# Patient Record
Sex: Female | Born: 2005 | Race: White | Hispanic: No | Marital: Single | Smoking: Never smoker
Health system: Southern US, Community
[De-identification: ages and names within clinical notes are randomized; demographics above are authoritative.]

## PROBLEM LIST (undated history)

## (undated) HISTORY — PX: TONSILLECTOMY: SUR1361

---
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
## 2023-01-03 DIAGNOSIS — R102 Pelvic and perineal pain: Secondary | ICD-10-CM | POA: Diagnosis not present

## 2023-01-10 DIAGNOSIS — R102 Pelvic and perineal pain: Secondary | ICD-10-CM | POA: Diagnosis not present

## 2023-01-10 DIAGNOSIS — Z7689 Persons encountering health services in other specified circumstances: Secondary | ICD-10-CM | POA: Diagnosis not present

## 2023-01-18 ENCOUNTER — Encounter: Payer: Self-pay | Admitting: Internal Medicine

## 2023-01-18 ENCOUNTER — Ambulatory Visit: Payer: BC Managed Care – PPO | Admitting: Internal Medicine

## 2023-01-18 VITALS — BP 126/72 | HR 58 | Temp 97.3°F | Ht 67.0 in | Wt 237.0 lb

## 2023-01-18 DIAGNOSIS — E6609 Other obesity due to excess calories: Secondary | ICD-10-CM | POA: Diagnosis not present

## 2023-01-18 DIAGNOSIS — Z6837 Body mass index (BMI) 37.0-37.9, adult: Secondary | ICD-10-CM | POA: Diagnosis not present

## 2023-01-18 DIAGNOSIS — Z0001 Encounter for general adult medical examination with abnormal findings: Secondary | ICD-10-CM

## 2023-01-18 DIAGNOSIS — E66811 Obesity, class 1: Secondary | ICD-10-CM | POA: Insufficient documentation

## 2023-01-18 NOTE — Assessment & Plan Note (Signed)
Encourage diet and exercise for weight loss 

## 2023-01-18 NOTE — Progress Notes (Signed)
HPI  Patient presents to clinic today to establish care.  She would like her annual exam today.  Flu: never Tetanus: < 10 years ago Covid: never Dentist: biannually  Diet: She does eat meat. She consumes fruits and veggies. She tries to avoid fried foods. She drinks mostly water. Exercise: None  History reviewed. No pertinent past medical history.  Current Outpatient Medications  Medication Sig Dispense Refill   benzonatate (TESSALON PERLES) 100 MG capsule Take 1-2 tabs TID prn cough (Patient not taking: Reported on 01/18/2023) 30 capsule 0   fluticasone (FLONASE) 50 MCG/ACT nasal spray Place 2 sprays into both nostrils daily. (Patient not taking: Reported on 01/18/2023) 16 g 0   ondansetron (ZOFRAN ODT) 4 MG disintegrating tablet Take 1 tablet (4 mg total) by mouth every 8 (eight) hours as needed. (Patient not taking: Reported on 01/18/2023) 15 tablet 0   No current facility-administered medications for this visit.    No Known Allergies  Family History  Problem Relation Age of Onset   Hypertension Mother    Hypertension Father    Healthy Sister    Healthy Brother    Diabetes Maternal Grandmother    Hypertension Maternal Grandmother    Cancer Maternal Grandfather        ??    Social History   Socioeconomic History   Marital status: Single    Spouse name: Not on file   Number of children: Not on file   Years of education: Not on file   Highest education level: Not on file  Occupational History   Not on file  Tobacco Use   Smoking status: Never   Smokeless tobacco: Never  Vaping Use   Vaping Use: Every day  Substance and Sexual Activity   Alcohol use: Not on file   Drug use: Not on file   Sexual activity: Not on file  Other Topics Concern   Not on file  Social History Narrative   Not on file   Social Determinants of Health   Financial Resource Strain: Not on file  Food Insecurity: Not on file  Transportation Needs: Not on file  Physical Activity: Not on  file  Stress: Not on file  Social Connections: Not on file  Intimate Partner Violence: Not on file    ROS:  Constitutional: Denies fever, malaise, fatigue, headache or abrupt weight changes.  HEENT: Denies eye pain, eye redness, ear pain, ringing in the ears, wax buildup, runny nose, nasal congestion, bloody nose, or sore throat. Respiratory: Denies difficulty breathing, shortness of breath, cough or sputum production.   Cardiovascular: Denies chest pain, chest tightness, palpitations or swelling in the hands or feet.  Gastrointestinal: Denies abdominal pain, bloating, constipation, diarrhea or blood in the stool.  GU: Denies frequency, urgency, pain with urination, blood in urine, odor or discharge. Musculoskeletal: Denies decrease in range of motion, difficulty with gait, muscle pain or joint pain and swelling.  Skin: Denies redness, rashes, lesions or ulcercations.  Neurological: Denies dizziness, difficulty with memory, difficulty with speech or problems with balance and coordination.  Psych: Denies anxiety, depression, SI/HI.  No other specific complaints in a complete review of systems (except as listed in HPI above).  PE:  BP 126/72 (BP Location: Right Arm, Patient Position: Sitting, Cuff Size: Large)   Pulse (!) 58   Temp (!) 97.3 F (36.3 C) (Temporal)   Ht 5\' 7"  (1.702 m)   Wt 237 lb (107.5 kg)   SpO2 98%   BMI 37.12 kg/m  Wt  Readings from Last 3 Encounters:  01/18/23 237 lb (107.5 kg) (>99 %, Z= 2.36)*  06/16/22 210 lb (95.3 kg) (98 %, Z= 2.10)*  11/30/18 171 lb 11.8 oz (77.9 kg) (96 %, Z= 1.80)*   * Growth percentiles are based on CDC (Girls, 2-20 Years) data.    General: Appears her stated age, obese, in NAD. HEENT: Head: normal shape and size; Eyes: sclera white, no icterus, conjunctiva pink, PERRLA and EOMs intact;  Neck: Neck supple, trachea midline. No masses, lumps or thyromegaly present.  Cardiovascular: Bradycardic with normal rhythm. S1,S2 noted.  No  murmur, rubs or gallops noted. No JVD or BLE edema.  Pulmonary/Chest: Normal effort and positive vesicular breath sounds. No respiratory distress. No wheezes, rales or ronchi noted.  Abdomen: Soft and nontender. Normal bowel sounds. Musculoskeletal: Strength 5/5 BUE/BLE. No difficulty with gait.  Neurological: Alert and oriented. Cranial nerves II-XII grossly intact. Coordination normal.  Psychiatric: Mood and affect normal. Behavior is normal. Judgment and thought content normal.     BMET    Component Value Date/Time   NA 138 06/16/2022 1943   K 3.7 06/16/2022 1943   CL 106 06/16/2022 1943   CO2 26 06/16/2022 1943   GLUCOSE 94 06/16/2022 1943   BUN 10 06/16/2022 1943   CREATININE 0.74 06/16/2022 1943   CALCIUM 9.3 06/16/2022 1943   GFRNONAA >60 06/16/2022 1943   GFRAA 0 (L) 09/27/2018 2240    Lipid Panel  No results found for: "CHOL", "TRIG", "HDL", "CHOLHDL", "VLDL", "LDLCALC"  CBC    Component Value Date/Time   WBC 9.0 06/16/2022 1943   RBC 4.98 06/16/2022 1943   HGB 14.0 06/16/2022 1943   HCT 42.4 06/16/2022 1943   PLT 237 06/16/2022 1943   MCV 85.1 06/16/2022 1943   MCH 28.1 06/16/2022 1943   MCHC 33.0 06/16/2022 1943   RDW 12.7 06/16/2022 1943   LYMPHSABS 2.5 06/16/2022 1943   MONOABS 0.6 06/16/2022 1943   EOSABS 0.1 06/16/2022 1943   BASOSABS 0.0 06/16/2022 1943    Hgb A1C No results found for: "HGBA1C"   Assessment and Plan:  Preventative Health Maintenance:  Encouraged her to get a flu shot in the fall Tetanus UTD Encouraged her to get her COVID vaccine Encouraged her to consume a balanced diet and exercise regimen Advised her to see an eye doctor and dentist annually She declines lab work today  RTC in 1 year for your annual exam Webb Silversmith, NP

## 2023-01-18 NOTE — Patient Instructions (Signed)

## 2023-02-18 DIAGNOSIS — L700 Acne vulgaris: Secondary | ICD-10-CM | POA: Diagnosis not present

## 2023-03-24 ENCOUNTER — Ambulatory Visit: Payer: BC Managed Care – PPO | Admitting: Internal Medicine

## 2023-03-24 ENCOUNTER — Encounter: Payer: Self-pay | Admitting: Internal Medicine

## 2023-03-24 VITALS — BP 124/70 | HR 83 | Temp 95.7°F | Wt 227.0 lb

## 2023-03-24 DIAGNOSIS — R600 Localized edema: Secondary | ICD-10-CM | POA: Diagnosis not present

## 2023-03-24 DIAGNOSIS — G250 Essential tremor: Secondary | ICD-10-CM

## 2023-03-24 DIAGNOSIS — R61 Generalized hyperhidrosis: Secondary | ICD-10-CM

## 2023-03-24 MED ORDER — GLYCOPYRROLATE 1 MG PO TABS: 1.0000 mg | ORAL_TABLET | Freq: Every day | ORAL | 1 refills | Status: AC

## 2023-03-24 NOTE — Assessment & Plan Note (Signed)
Discussed propranolol to see if this would help control tremor however I do not think this is necessary at this point We will continue to monitor symptoms for now

## 2023-03-24 NOTE — Patient Instructions (Signed)
Hyperhidrosis Hyperhidrosis is a condition in which the body sweats a lot more than normal (excessively). Sweating is a necessary function for a human body. It is normal to sweat when you are hot, physically active, or anxious. However, hyperhidrosis is sweating to an excessive degree. Although the condition is not a serious one, it can cause embarrassment. There are two kinds of hyperhidrosis: Primary hyperhidrosis. The sweating usually localizes in one part of your body, such as your underarms, or in a few areas, such as your feet, face, underarms, and hands. This is the more common kind of hyperhidrosis. Secondary hyperhidrosis. This type usually affects your entire body. What are the causes? The cause of this condition depends on the kind of hyperhidrosis that you have. Primary hyperhidrosis may be caused by sweat glands that are more active than normal. Secondary hyperhidrosis may be caused by an underlying condition or by taking certain medicines, such as antidepressants or diabetes medicines. Possible conditions that may cause secondary hyperhidrosis include: Diabetes. Hot flashes during menopause. Certain types of cancers. Obesity. Overactive thyroid (hyperthyroidism). Nervous system injury or disorders. What increases the risk? You are more likely to develop primary hyperhidrosis if you have a family history of the condition. What are the signs or symptoms? Symptoms of this condition include: Feeling like you are sweating constantly, even while you are not being active. Having skin that peels or gets paler or softer in the areas where you sweat the most. Being able to see sweat on your skin. Other symptoms depend on the kind of hyperhidrosis that you have. Symptoms of primary hyperhidrosis may include: Sweating in the same location on both sides of your body. Sweating only during the day and not while you are sleeping. Sweating in specific areas, such as your underarms, palms,  feet, and face. Symptoms of secondary hyperhidrosis may include: Sweating all over your body. Sweating even while you sleep. How is this diagnosed? This condition may be diagnosed by: Medical history. Physical exam. You may also have other tests, including: Tests to measure the amount of sweat you produce and to show the areas where you sweat the most. These tests may involve: Using color-changing chemicals to show patterns of sweating on the skin. Weighing paper that has been applied to the skin. This will show the amount of sweat that your body produces. Measuring the amount of water that evaporates from the skin. Using infrared technology to show patterns of sweating on the skin. Tests to check for other conditions that may be causing excess sweating. These tests may include blood, urine, or imaging tests. How is this treated? Treatment for this condition depends on the kind of hyperhidrosis that you have and the areas of your body that are affected. Your health care provider will also treat any underlying conditions. Treatment may include: Medicines, such as: Antiperspirants. These are medicines that stop sweat. Injectable medicines. These may include small injections of botulinum toxin. Oral medicines. These are taken by mouth to treat underlying conditions and other symptoms. A procedure to: Temporarily turn off the sweat glands in your hands and feet (iontophoresis). Remove your sweat glands. Cut or destroy the nerves so that they do not send a signal to the sweat glands (sympathectomy). Follow these instructions at home: Lifestyle  Limit or avoid foods or beverages that may increase your risk of sweating, such as: Spicy food. Caffeine. Alcohol. Foods that contain monosodium glutamate (MSG). If your feet sweat: Wear sandals when possible. Do not wear cotton socks. Wear socks  that remove or wick moisture from your feet. Wear leather shoes. Avoid wearing the same pair of  shoes for two days in a row. Try placing sweat pads under your clothes to prevent underarm sweat from showing. Keep a journal of your sweat symptoms and when they occur. This may help you identify things that trigger your sweating. General instructions Take over-the-counter and prescription medicines only as told by your health care provider. Use antiperspirants as told by your health care provider. Consider joining a hyperhidrosis support group. Where to find more information [employer]: [employer] Contact a health care provider if: You have new symptoms. Your symptoms get worse. Summary Hyperhidrosis is a condition in which the body sweats a lot more than normal. With primary hyperhidrosis, the sweating usually localizes in one part of your body, such as your underarms, or in a few areas, such as your feet, face, underarms, and hands. It is caused by overactive sweat glands in the affected area. With secondary hyperhidrosis, the sweating affects your entire body. This is caused by an underlying condition or by taking certain medicines. Treatment for this condition depends on the kind of hyperhidrosis that you have and the parts of your body that are affected. This information is not intended to replace advice given to you by your health care provider. Make sure you discuss any questions you have with your health care provider. Document Revised: 12/15/2021 Document Reviewed: 12/15/2021 Elsevier Patient Education  2024 [employer].

## 2023-03-24 NOTE — Progress Notes (Signed)
Subjective:    Patient ID: Holly Everett, female    DOB: [birth year], 17 y.o.   MRN: [id number]  HPI  Patient presents to clinic today with complaint of excessive sweating. It seems worse on her underarms and feet. She reports she sweats even when she is not hot.  She has tried multiple deodorants OTC with minimal relief of symptoms.  She had a normal TSH 11/2022.  She also reports intermittent ankle swelling. She has noticed this over the last few weeks. She has not noticed any redness. She does not wake up with her ankles swollen. She has tried elevating her feet with some relief of symptoms.   She also reports intermittent tremors of her hands and feet.  She has noticed this when she was lying down to sleep that her right hand would shake.  She reports her mother was also trying to paint her toenails the other day and her feet were shaking.  She reports she tried to stop them but they continued to shake.  Review of Systems  No past medical history on file.  No current outpatient medications on file.   No current facility-administered medications for this visit.    No Known Allergies  Family History  Problem Relation Age of Onset   Hypertension Mother    Hypertension Father    Healthy Sister    Healthy Brother    Diabetes Maternal Grandmother    Hypertension Maternal Grandmother    Cancer Maternal Grandfather        ??    Social History   Socioeconomic History   Marital status: Single    Spouse name: Not on file   Number of children: Not on file   Years of education: Not on file   Highest education level: Not on file  Occupational History   Not on file  Tobacco Use   Smoking status: Never   Smokeless tobacco: Never  Vaping Use   Vaping Use: Every day  Substance and Sexual Activity   Alcohol use: Not on file   Drug use: Not on file   Sexual activity: Not on file  Other Topics Concern   Not on file  Social History Narrative   Not on file   Social Determinants  of Health   Financial Resource Strain: Not on file  Food Insecurity: Not on file  Transportation Needs: Not on file  Physical Activity: Not on file  Stress: Not on file  Social Connections: Not on file  Intimate Partner Violence: Not on file     Constitutional: Denies fever, malaise, fatigue, headache or abrupt weight changes.  HEENT: Denies eye pain, eye redness, ear pain, ringing in the ears, wax buildup, runny nose, nasal congestion, bloody nose, or sore throat. Respiratory: Denies difficulty breathing, shortness of breath, cough or sputum production.   Cardiovascular: Patient reports intermittent swelling of ankles.  Denies chest pain, chest tightness, palpitations or swelling in the hands.  Gastrointestinal: Denies abdominal pain, bloating, constipation, diarrhea or blood in the stool.  GU: Denies urgency, frequency, pain with urination, burning sensation, blood in urine, odor or discharge. Musculoskeletal: Denies decrease in range of motion, difficulty with gait, muscle pain or joint pain and swelling.  Skin: Patient reports excessive sweating.  Denies redness, rashes, lesions or ulcercations.  Neurological: Patient reports intermittent tremor of hands and feet.  Denies dizziness, difficulty with memory, difficulty with speech or problems with balance and coordination.  Psych: Denies anxiety, depression, SI/HI.  No other specific complaints in  a complete review of systems (except as listed in HPI above).     Objective:   Physical Exam  BP 124/70 (BP Location: Left Arm, Patient Position: Sitting, Cuff Size: Large)   Pulse 83   Temp (!) 95.7 F (35.4 C) (Temporal)   Wt 227 lb (103 kg)   SpO2 98%   BMI 35.55 kg/m   Wt Readings from Last 3 Encounters:  01/18/23 237 lb (107.5 kg) (>99 %, Z= 2.36)*  06/16/22 210 lb (95.3 kg) (98 %, Z= 2.10)*  11/30/18 171 lb 11.8 oz (77.9 kg) (96 %, Z= 1.80)*   * Growth percentiles are based on CDC (Girls, 2-20 Years) data.    General:  Appears her stated age, obese, in NAD. Skin: Warm, dry and intact.  HEENT: Head: normal shape and size; Eyes: sclera white, no icterus, conjunctiva pink, PERRLA and EOMs intact;  Cardiovascular: Normal rate and rhythm.  No JVD or BLE edema.  Pulmonary/Chest: Normal effort and positive vesicular breath sounds. No respiratory distress. No wheezes, rales or ronchi noted.  Musculoskeletal: No difficulty with gait.  Neurological: Alert and oriented.  Coordination normal.  Psychiatric: Mood and affect normal. Behavior is normal. Judgment and thought content normal.    BMET    Component Value Date/Time   NA 138 06/16/2022 1943   K 3.7 06/16/2022 1943   CL 106 06/16/2022 1943   CO2 26 06/16/2022 1943   GLUCOSE 94 06/16/2022 1943   BUN 10 06/16/2022 1943   CREATININE 0.74 06/16/2022 1943   CALCIUM 9.3 06/16/2022 1943   GFRNONAA >60 06/16/2022 1943   GFRAA 0 (L) 09/27/2018 2240    Lipid Panel  No results found for: "CHOL", "TRIG", "HDL", "CHOLHDL", "VLDL", "LDLCALC"  CBC    Component Value Date/Time   WBC 9.0 06/16/2022 1943   RBC 4.98 06/16/2022 1943   HGB 14.0 06/16/2022 1943   HCT 42.4 06/16/2022 1943   PLT 237 06/16/2022 1943   MCV 85.1 06/16/2022 1943   MCH 28.1 06/16/2022 1943   MCHC 33.0 06/16/2022 1943   RDW 12.7 06/16/2022 1943   LYMPHSABS 2.5 06/16/2022 1943   MONOABS 0.6 06/16/2022 1943   EOSABS 0.1 06/16/2022 1943   BASOSABS 0.0 06/16/2022 1943    Hgb A1C No results found for: "HGBA1C"         Assessment & Plan:   Peripheral Edema:  Likely combination of standing on her feet for long periods of time, weight and heat Recommend compression socks Encouraged elevation when she gets up and walk  RTC in 10 months for annual exam Nicki Reaper, NP

## 2023-03-24 NOTE — Assessment & Plan Note (Signed)
Will try glycopyrrolate 1 mg daily

## 2023-04-11 ENCOUNTER — Ambulatory Visit: Payer: BC Managed Care – PPO | Admitting: Internal Medicine

## 2023-04-11 ENCOUNTER — Encounter: Payer: Self-pay | Admitting: Internal Medicine

## 2023-04-11 ENCOUNTER — Other Ambulatory Visit (HOSPITAL_COMMUNITY)
Admission: RE | Admit: 2023-04-11 | Discharge: 2023-04-11 | Disposition: A | Discharge status: Home / Self Care | Payer: BC Managed Care – PPO | Source: Ambulatory Visit | Attending: Internal Medicine | Admitting: Internal Medicine

## 2023-04-11 VITALS — BP 116/68 | HR 84 | Temp 96.8°F | Wt 226.0 lb

## 2023-04-11 DIAGNOSIS — R350 Frequency of micturition: Secondary | ICD-10-CM | POA: Insufficient documentation

## 2023-04-11 DIAGNOSIS — R300 Dysuria: Secondary | ICD-10-CM | POA: Diagnosis not present

## 2023-04-11 DIAGNOSIS — R102 Pelvic and perineal pain: Secondary | ICD-10-CM | POA: Diagnosis not present

## 2023-04-11 DIAGNOSIS — N898 Other specified noninflammatory disorders of vagina: Secondary | ICD-10-CM | POA: Diagnosis not present

## 2023-04-11 LAB — POCT URINALYSIS DIPSTICK
Bilirubin, UA: NEGATIVE
Glucose, UA: NEGATIVE
Ketones, UA: NEGATIVE
Leukocytes, UA: NEGATIVE
Nitrite, UA: NEGATIVE
Protein, UA: NEGATIVE
Spec Grav, UA: 1.025 (ref 1.010–1.025)
Urobilinogen, UA: 0.2 E.U./dL
pH, UA: 5 (ref 5.0–8.0)

## 2023-04-11 NOTE — Patient Instructions (Signed)
Pelvic Pain, Female Pelvic pain is pain in your lower belly (abdomen), below your belly button and between your hips. The pain may: Start all of a sudden (be acute). Keep coming back (be recurring). Last a long time (become chronic). Pelvic pain that lasts longer than 6 months is called chronic pelvic pain. There are many causes of pelvic pain. Sometimes the cause of pelvic pain is not known. Follow these instructions at home:  Take over-the-counter and prescription medicines only as told by your doctor. Rest as told by your doctor. Do not have sex if it hurts. Keep a journal of your pelvic pain. Write down: When the pain started. Where the pain is located. What seems to make the pain better or worse, such as food or your monthly period (menstrual cycle). Any symptoms you have along with the pain. Keep all follow-up visits. Contact a doctor if: Medicine does not help your pain, or your pain comes back. You have new symptoms. You have unusual discharge or bleeding from your vagina. You have a fever or chills. You are having trouble pooping (constipation). You have blood in your pee (urine) or poop (stool). Your pee smells bad. You feel weak or light-headed. Get help right away if: You have sudden pain that is very bad. You have very bad pain and also have any of these symptoms: A fever. Feeling like you may vomit (nauseous). Vomiting. Being very sweaty. You faint. These symptoms may be an emergency. Get help right away. Call your local emergency services (911 in the [place] Do not wait to see if the symptoms will go away. Do not drive yourself to the hospital. Summary Pelvic pain is pain in your lower belly (abdomen), below your belly button and between your hips. There are many causes of pelvic pain. Keep a journal of your pelvic pain. This information is not intended to replace advice given to you by your health care provider. Make sure you discuss any questions you have with  your health care provider. Document Revised: 02/24/2021 Document Reviewed: 02/24/2021 Elsevier Patient Education  2024 [employer].

## 2023-04-11 NOTE — Progress Notes (Signed)
HPI  Pt presents to the clinic today with c/o pelvic pain, urinary frequency, burning with urination, vaginal odor.  She denies dysuria, blood in her urine or vaginal discharge,  She noticed this 10 days ago. Her LMP was 04/02/23.  She is sexually active.  She has not tried anything OTC for this.   Review of Systems  No past medical history on file.  Family History  Problem Relation Age of Onset   Hypertension Mother    Hypertension Father    Healthy Sister    Healthy Brother    Diabetes Maternal Grandmother    Hypertension Maternal Grandmother    Cancer Maternal Grandfather        ??    Social History   Socioeconomic History   Marital status: Single    Spouse name: Not on file   Number of children: Not on file   Years of education: Not on file   Highest education level: Not on file  Occupational History   Not on file  Tobacco Use   Smoking status: Never   Smokeless tobacco: Never  Vaping Use   Vaping Use: Every day  Substance and Sexual Activity   Alcohol use: Not on file   Drug use: Not on file   Sexual activity: Not on file  Other Topics Concern   Not on file  Social History Narrative   Not on file   Social Determinants of Health   Financial Resource Strain: Not on file  Food Insecurity: Not on file  Transportation Needs: Not on file  Physical Activity: Not on file  Stress: Not on file  Social Connections: Not on file  Intimate Partner Violence: Not on file    No Known Allergies   Constitutional: Denies fever, malaise, fatigue, headache or abrupt weight changes.   GU: Pt reports frequency, burning with urination, vaginal odor. Denies pain with urination, blood in urine. Skin: Denies redness, rashes, lesions or ulcercations.   No other specific complaints in a complete review of systems (except as listed in HPI above).    Objective:   Physical Exam  BP 116/68 (BP Location: Right Arm, Patient Position: Sitting, Cuff Size: Large)   Pulse 84   Temp  (!) 96.8 F (36 C) (Temporal)   Wt 226 lb (102.5 kg)   SpO2 99%   BMI 35.40 kg/m   Wt Readings from Last 3 Encounters:  03/24/23 227 lb (103 kg) (99 %, Z= 2.27)*  01/18/23 237 lb (107.5 kg) (>99 %, Z= 2.36)*  06/16/22 210 lb (95.3 kg) (98 %, Z= 2.10)*   * Growth percentiles are based on CDC (Girls, 2-20 Years) data.    General: Appears her stated age, obese, in NAD. Cardiovascular: Normal rate and rhythm. S1,S2 noted.   Pulmonary/Chest: Normal effort and positive vesicular breath sounds. No respiratory distress. No wheezes, rales or ronchi noted.  Abdomen: Soft. Normal bowel sounds. No distention or masses noted.  Tender to palpation over the bladder area. No CVA tenderness.        Assessment & Plan:   Frequency, Burning with Urination, Vaginal Odor:  Urinalysis: Trace blood Will obtain wet prep to check for BV, yeast, gonorrhea, chlamydia trichomoniasis OK to take AZO OTC Drink plenty of fluids  RTC in 9 months for your annual exam Nicki Reaper, NP

## 2023-04-12 ENCOUNTER — Encounter: Payer: Self-pay | Admitting: Internal Medicine

## 2023-04-13 LAB — CERVICOVAGINAL ANCILLARY ONLY
Bacterial Vaginitis (gardnerella): POSITIVE — AB
Candida Glabrata: NEGATIVE
Candida Vaginitis: NEGATIVE
Chlamydia: NEGATIVE
Comment: NEGATIVE
Comment: NEGATIVE
Comment: NEGATIVE
Comment: NEGATIVE
Comment: NEGATIVE
Comment: NORMAL
Neisseria Gonorrhea: NEGATIVE
Trichomonas: NEGATIVE

## 2023-04-14 ENCOUNTER — Encounter: Payer: Self-pay | Admitting: Internal Medicine

## 2023-04-14 MED ORDER — METRONIDAZOLE 500 MG PO TABS: 500.0000 mg | ORAL_TABLET | Freq: Two times a day (BID) | ORAL | 0 refills | Status: DC

## 2023-06-03 DIAGNOSIS — N946 Dysmenorrhea, unspecified: Secondary | ICD-10-CM | POA: Diagnosis not present

## 2023-06-03 DIAGNOSIS — R102 Pelvic and perineal pain: Secondary | ICD-10-CM | POA: Diagnosis not present

## 2023-06-17 DIAGNOSIS — R102 Pelvic and perineal pain: Secondary | ICD-10-CM | POA: Diagnosis not present

## 2023-06-17 DIAGNOSIS — N946 Dysmenorrhea, unspecified: Secondary | ICD-10-CM | POA: Diagnosis not present

## 2023-07-01 ENCOUNTER — Ambulatory Visit: Payer: BC Managed Care – PPO | Admitting: Internal Medicine

## 2023-07-08 DIAGNOSIS — N946 Dysmenorrhea, unspecified: Secondary | ICD-10-CM | POA: Diagnosis not present

## 2023-07-08 DIAGNOSIS — R102 Pelvic and perineal pain: Secondary | ICD-10-CM | POA: Diagnosis not present

## 2023-07-13 ENCOUNTER — Encounter: Payer: Self-pay | Admitting: Internal Medicine

## 2023-07-26 ENCOUNTER — Ambulatory Visit: Payer: Self-pay

## 2023-07-26 DIAGNOSIS — M545 Low back pain, unspecified: Secondary | ICD-10-CM | POA: Diagnosis not present

## 2023-07-26 DIAGNOSIS — M48061 Spinal stenosis, lumbar region without neurogenic claudication: Secondary | ICD-10-CM | POA: Diagnosis not present

## 2023-07-26 DIAGNOSIS — R350 Frequency of micturition: Secondary | ICD-10-CM | POA: Diagnosis not present

## 2023-07-26 DIAGNOSIS — M438X6 Other specified deforming dorsopathies, lumbar region: Secondary | ICD-10-CM | POA: Diagnosis not present

## 2023-07-26 DIAGNOSIS — R112 Nausea with vomiting, unspecified: Secondary | ICD-10-CM | POA: Diagnosis not present

## 2023-07-26 DIAGNOSIS — R109 Unspecified abdominal pain: Secondary | ICD-10-CM | POA: Diagnosis not present

## 2023-07-26 NOTE — Telephone Encounter (Signed)
  Chief Complaint: bilateral flank pain Symptoms: pain caused vomiting took 3 motrin and pain now a 4 , urinary frequency Frequency: this am  Pertinent Negatives: Patient denies fever Disposition: [] ED /[x] Urgent Care (no appt availability in office) / [] Appointment(In office/virtual)/ []  [facility]/ [] Home Care/ [] Refused Recommended Disposition /[] [facility]/ []  Follow-up with PCP Additional Notes: no appts available Reason for Disposition  MODERATE pain (e.g., interferes with normal activities or awakens from sleep)  Answer Assessment - Initial Assessment Questions 1. LOCATION: "Where does it hurt?" (e.g., left, right)     Right> left  2. ONSET: "When did the pain start?"     This am  3. SEVERITY: "How bad is the pain?" (e.g., Scale 1-10; mild, moderate, or severe)   - MILD (1-3): doesn't interfere with normal activities    - MODERATE (4-7): interferes with normal activities or awakens from sleep    - SEVERE (8-10): excruciating pain and patient unable to do normal activities (stays in bed)       4/10 4. PATTERN: "Does the pain come and go, or is it constant?"      Comes in waves 5. CAUSE: "What do you think is causing the pain?"     Kidney stones or back pain 6. OTHER SYMPTOMS:  "Do you have any other symptoms?" (e.g., fever, abdomen pain, vomiting, leg weakness, burning with urination, blood in urine)     Urinary frequently and was nauseated from pain  7. PREGNANCY:  "Is there any chance you are pregnant?" "When was your last menstrual period?"     N/a  Protocols used: Flank Pain-A-AH

## 2024-01-10 DIAGNOSIS — N941 Unspecified dyspareunia: Secondary | ICD-10-CM | POA: Diagnosis not present

## 2024-01-10 DIAGNOSIS — N946 Dysmenorrhea, unspecified: Secondary | ICD-10-CM | POA: Diagnosis not present

## 2024-01-10 DIAGNOSIS — R102 Pelvic and perineal pain: Secondary | ICD-10-CM | POA: Diagnosis not present

## 2024-01-19 ENCOUNTER — Encounter: Payer: Self-pay | Admitting: Internal Medicine

## 2024-03-07 DIAGNOSIS — N941 Unspecified dyspareunia: Secondary | ICD-10-CM | POA: Diagnosis not present

## 2024-03-07 DIAGNOSIS — N946 Dysmenorrhea, unspecified: Secondary | ICD-10-CM | POA: Diagnosis not present

## 2024-03-07 DIAGNOSIS — R102 Pelvic and perineal pain: Secondary | ICD-10-CM | POA: Diagnosis not present

## 2024-03-28 ENCOUNTER — Ambulatory Visit (INDEPENDENT_AMBULATORY_CARE_PROVIDER_SITE_OTHER): Admitting: Internal Medicine

## 2024-03-28 ENCOUNTER — Encounter: Payer: Self-pay | Admitting: Internal Medicine

## 2024-03-28 VITALS — BP 122/74 | Ht 67.0 in | Wt 207.0 lb

## 2024-03-28 DIAGNOSIS — Z6832 Body mass index (BMI) 32.0-32.9, adult: Secondary | ICD-10-CM

## 2024-03-28 DIAGNOSIS — Z0001 Encounter for general adult medical examination with abnormal findings: Secondary | ICD-10-CM

## 2024-03-28 DIAGNOSIS — N3281 Overactive bladder: Secondary | ICD-10-CM | POA: Diagnosis not present

## 2024-03-28 DIAGNOSIS — E66811 Obesity, class 1: Secondary | ICD-10-CM | POA: Diagnosis not present

## 2024-03-28 DIAGNOSIS — E6609 Other obesity due to excess calories: Secondary | ICD-10-CM

## 2024-03-28 MED ORDER — OXYBUTYNIN CHLORIDE ER 5 MG PO TB24: 5.0000 mg | ORAL_TABLET | Freq: Every day | ORAL | 1 refills | Status: DC

## 2024-03-28 MED ORDER — NICOTINE 14 MG/24HR TD PT24: 14.0000 mg | MEDICATED_PATCH | Freq: Every day | TRANSDERMAL | 0 refills | Status: DC

## 2024-03-28 MED ORDER — ALBUTEROL SULFATE HFA 108 (90 BASE) MCG/ACT IN AERS: 2.0000 | INHALATION_SPRAY | Freq: Four times a day (QID) | RESPIRATORY_TRACT | 0 refills | Status: DC | PRN

## 2024-03-28 NOTE — Assessment & Plan Note (Signed)
 Encourage diet and exercise for weight loss

## 2024-03-28 NOTE — Addendum Note (Signed)
 Addended by: Carollynn Cirri on: 03/28/2024 01:01 PM   Modules accepted: Orders

## 2024-03-28 NOTE — Addendum Note (Signed)
 Addended by: Carollynn Cirri on: 03/28/2024 12:41 PM   Modules accepted: Orders

## 2024-03-28 NOTE — Progress Notes (Signed)
 HPI  Patient presents to clinic today for annual exam today.  Flu: never Tetanus: 06/2015 Covid: never Dentist: biannually  Diet: She does eat meat. She consumes fruits and veggies. She tries to avoid fried foods. She drinks mostly water. Exercise: None  No past medical history on file.  Current Outpatient Medications  Medication Sig Dispense Refill   glycopyrrolate  (ROBINUL ) 1 MG tablet Take 1 tablet (1 mg total) by mouth daily. 90 tablet 1   metroNIDAZOLE  (FLAGYL ) 500 MG tablet Take 1 tablet (500 mg total) by mouth 2 (two) times daily. Do not drink alcohol while taking this medicine. 14 tablet 0   No current facility-administered medications for this visit.    No Known Allergies  Family History  Problem Relation Age of Onset   Hypertension Mother    Hypertension Father    Healthy Sister    Healthy Brother    Diabetes Maternal Grandmother    Hypertension Maternal Grandmother    Cancer Maternal Grandfather        ??    Social History   Socioeconomic History   Marital status: Single    Spouse name: Not on file   Number of children: Not on file   Years of education: Not on file   Highest education level: Not on file  Occupational History   Not on file  Tobacco Use   Smoking status: Never   Smokeless tobacco: Never  Vaping Use   Vaping status: Every Day  Substance and Sexual Activity   Alcohol use: Not on file   Drug use: Not on file   Sexual activity: Not on file  Other Topics Concern   Not on file  Social History Narrative   Not on file   Social Drivers of Health   Financial Resource Strain: Not on file  Food Insecurity: Not on file  Transportation Needs: Not on file  Physical Activity: Not on file  Stress: Not on file  Social Connections: Not on file  Intimate Partner Violence: Not on file    ROS:  Constitutional: Patient reports difficulty losing weight.  Denies fever, malaise, fatigue, headache or abrupt weight changes.  HEENT: Denies eye  pain, eye redness, ear pain, ringing in the ears, wax buildup, runny nose, nasal congestion, bloody nose, or sore throat. Respiratory: Denies difficulty breathing, shortness of breath, cough or sputum production.   Cardiovascular: Denies chest pain, chest tightness, palpitations or swelling in the hands or feet.  Gastrointestinal: Pt reports intermittent constipation. Denies abdominal pain, bloating, diarrhea or blood in the stool.  GU: Pt reports urinary frequency. Denies urgency, pain with urination, blood in urine, odor or discharge. Musculoskeletal: Denies decrease in range of motion, difficulty with gait, muscle pain or joint pain and swelling.  Skin: Pt reports excessive sweating. Denies redness, rashes, lesions or ulcercations.  Neurological: Pt reports tremor. Denies dizziness, difficulty with memory, difficulty with speech or problems with balance and coordination.  Psych: Denies anxiety, depression, SI/HI.  No other specific complaints in a complete review of systems (except as listed in HPI above).  PE: BP 122/74 (BP Location: Left Arm, Patient Position: Sitting, Cuff Size: Normal)   Ht 5\' 7"  (1.702 m)   Wt 207 lb (93.9 kg)   LMP 03/07/2024 (Exact Date)   BMI 32.42 kg/m   Wt Readings from Last 3 Encounters:  04/11/23 226 lb (102.5 kg) (99%, Z= 2.26)*  03/24/23 227 lb (103 kg) (99%, Z= 2.27)*  01/18/23 237 lb (107.5 kg) (>99%, Z= 2.36)*   *  Growth percentiles are based on CDC (Girls, 2-20 Years) data.    General: Appears her stated age, obese, in NAD. HEENT: Head: normal shape and size; Eyes: sclera white, no icterus, conjunctiva pink, PERRLA and EOMs intact;  Neck: Neck supple, trachea midline. No masses, lumps or thyromegaly present.  Cardiovascular: Normal rate and rhythm. S1,S2 noted.  No murmur, rubs or gallops noted. No JVD or BLE edema.  Pulmonary/Chest: Normal effort and positive vesicular breath sounds. No respiratory distress. No wheezes, rales or ronchi noted.   Abdomen: Soft and nontender. Normal bowel sounds. Musculoskeletal: Strength 5/5 BUE/BLE. No difficulty with gait.  Neurological: Alert and oriented. Cranial nerves II-XII grossly intact. Coordination normal.  Psychiatric: Mood and affect normal. Behavior is normal. Judgment and thought content normal.     BMET    Component Value Date/Time   NA 138 06/16/2022 1943   K 3.7 06/16/2022 1943   CL 106 06/16/2022 1943   CO2 26 06/16/2022 1943   GLUCOSE 94 06/16/2022 1943   BUN 10 06/16/2022 1943   CREATININE 0.74 06/16/2022 1943   CALCIUM 9.3 06/16/2022 1943   GFRNONAA >60 06/16/2022 1943   GFRAA 0 (L) 09/27/2018 2240    Lipid Panel  No results found for: "CHOL", "TRIG", "HDL", "CHOLHDL", "VLDL", "LDLCALC"  CBC    Component Value Date/Time   WBC 9.0 06/16/2022 1943   RBC 4.98 06/16/2022 1943   HGB 14.0 06/16/2022 1943   HCT 42.4 06/16/2022 1943   PLT 237 06/16/2022 1943   MCV 85.1 06/16/2022 1943   MCH 28.1 06/16/2022 1943   MCHC 33.0 06/16/2022 1943   RDW 12.7 06/16/2022 1943   LYMPHSABS 2.5 06/16/2022 1943   MONOABS 0.6 06/16/2022 1943   EOSABS 0.1 06/16/2022 1943   BASOSABS 0.0 06/16/2022 1943    Hgb A1C No results found for: "HGBA1C"   Assessment and Plan:  Preventative Health Maintenance:  Encouraged her to get a flu shot in the fall Tetanus UTD Encouraged her to get her COVID vaccine Encouraged her to consume a balanced diet and exercise regimen Advised her to see an eye doctor and dentist annually She declines lab work today  RTC in 6 months for follow-up of chronic conditions Helayne Lo, NP

## 2024-03-28 NOTE — Patient Instructions (Signed)

## 2024-03-28 NOTE — Assessment & Plan Note (Signed)
 Discussed use of kegel exercises versus pelvic floor therapy Rx for oxybutynin  5 mg XL daily

## 2024-03-31 ENCOUNTER — Encounter: Payer: Self-pay | Admitting: Internal Medicine

## 2024-04-02 MED ORDER — PHENTERMINE HCL 15 MG PO CAPS
15.0000 mg | ORAL_CAPSULE | ORAL | 0 refills | Status: DC
Start: 2024-04-02 — End: 2024-05-01

## 2024-04-22 ENCOUNTER — Other Ambulatory Visit: Payer: Self-pay | Admitting: Internal Medicine

## 2024-04-24 ENCOUNTER — Other Ambulatory Visit: Payer: Self-pay | Admitting: Internal Medicine

## 2024-04-24 NOTE — Telephone Encounter (Signed)
 Requested Prescriptions  Pending Prescriptions Disp Refills   nicotine  (NICODERM CQ  - DOSED IN MG/24 HOURS) 14 mg/24hr patch [Pharmacy Med Name: NICOTINE  14 MG/24HR PATCH] 28 patch 0    Sig: PLACE 1 PATCH ONTO THE SKIN DAILY.     Psychiatry:  Drug Dependence Therapy Failed - 04/24/2024  1:17 PM      Failed - Valid encounter within last 12 months    Recent Outpatient Visits           3 weeks ago Encounter for general adult medical examination with abnormal findings   [place] [facility] [place], Angeline ORN, NP

## 2024-04-25 NOTE — Telephone Encounter (Signed)
 Requested Prescriptions  Pending Prescriptions Disp Refills   albuterol  (VENTOLIN  HFA) 108 (90 Base) MCG/ACT inhaler [Pharmacy Med Name: ALBUTEROL  HFA (PROAIR ) INHALER] 8.5 each 0    Sig: TAKE 2 PUFFS BY MOUTH EVERY 6 HOURS AS NEEDED FOR WHEEZE OR SHORTNESS OF BREATH     Pulmonology:  Beta Agonists 2 Failed - 04/25/2024  1:36 PM      Failed - Valid encounter within last 12 months    Recent Outpatient Visits           4 weeks ago Encounter for general adult medical examination with abnormal findings   [place] [facility] [place], [place] W, NP              Passed - Last BP in normal range    BP Readings from Last 1 Encounters:  03/28/24 122/74         Passed - Last Heart Rate in normal range    Pulse Readings from Last 1 Encounters:  04/11/23 84

## 2024-05-01 ENCOUNTER — Encounter: Payer: Self-pay | Admitting: Internal Medicine

## 2024-05-01 ENCOUNTER — Ambulatory Visit: Admitting: Internal Medicine

## 2024-05-01 VITALS — BP 118/68 | Ht 67.0 in | Wt 199.4 lb

## 2024-05-01 DIAGNOSIS — Z6831 Body mass index (BMI) 31.0-31.9, adult: Secondary | ICD-10-CM | POA: Diagnosis not present

## 2024-05-01 DIAGNOSIS — E66811 Obesity, class 1: Secondary | ICD-10-CM

## 2024-05-01 DIAGNOSIS — E6609 Other obesity due to excess calories: Secondary | ICD-10-CM | POA: Diagnosis not present

## 2024-05-01 DIAGNOSIS — K629 Disease of anus and rectum, unspecified: Secondary | ICD-10-CM | POA: Diagnosis not present

## 2024-05-01 MED ORDER — VALACYCLOVIR HCL 1 G PO TABS: 1000.0000 mg | ORAL_TABLET | Freq: Two times a day (BID) | ORAL | 0 refills | Status: DC

## 2024-05-01 MED ORDER — PHENTERMINE HCL 30 MG PO CAPS
30.0000 mg | ORAL_CAPSULE | ORAL | 0 refills | Status: DC
Start: 2024-05-01 — End: 2024-05-25

## 2024-05-01 NOTE — Progress Notes (Signed)
 HPI  Patient presents to clinic today for 1 month follow-up for weight check.  At her last visit, she was started on terming 50 mg.  She has been taking the medication as prescribed.  Her starting weight was 207 with a BMI of 32.42.  Her weight today is 199 lb with a BMI of 31.23.  She does feel like she has plateaued with her weight loss.  She has noticed that she is hungrier than she had been when she first started it.  She also reports a lesion around her anus.  She noticed this 5 days ago.  The lesion burns with urination or bowel movement.  She is concerned is an anal fissure but she has not noticed any bleeding.  She has not tried anything OTC for this.  No past medical history on file.  Current Outpatient Medications  Medication Sig Dispense Refill   albuterol  (VENTOLIN  HFA) 108 (90 Base) MCG/ACT inhaler TAKE 2 PUFFS BY MOUTH EVERY 6 HOURS AS NEEDED FOR WHEEZE OR SHORTNESS OF BREATH 8.5 each 0   glycopyrrolate  (ROBINUL ) 1 MG tablet Take 1 tablet (1 mg total) by mouth daily. (Patient not taking: Reported on 03/28/2024) 90 tablet 1   ibuprofen  (ADVIL ) 800 MG tablet Take 800 mg by mouth.     nicotine  (NICODERM CQ  - DOSED IN MG/24 HOURS) 14 mg/24hr patch PLACE 1 PATCH ONTO THE SKIN DAILY. 28 patch 0   oxybutynin  (DITROPAN -XL) 5 MG 24 hr tablet Take 1 tablet (5 mg total) by mouth at bedtime. 90 tablet 1   phentermine  15 MG capsule Take 1 capsule (15 mg total) by mouth every morning. 30 capsule 0   tranexamic acid (LYSTEDA) 650 MG TABS tablet Take 1,300 mg by mouth.     No current facility-administered medications for this visit.    No Known Allergies  Family History  Problem Relation Age of Onset   Hypertension Mother    Hypertension Father    Healthy Sister    Healthy Brother    Diabetes Maternal Grandmother    Hypertension Maternal Grandmother    Cancer Maternal Grandfather        ??    Social History   Socioeconomic History   Marital status: Single    Spouse name: Not on  file   Number of children: Not on file   Years of education: Not on file   Highest education level: 12th grade  Occupational History   Not on file  Tobacco Use   Smoking status: Never   Smokeless tobacco: Never  Vaping Use   Vaping status: Every Day  Substance and Sexual Activity   Alcohol use: Not on file   Drug use: Not on file   Sexual activity: Not on file  Other Topics Concern   Not on file  Social History Narrative   Not on file   Social Drivers of Health   Financial Resource Strain: Medium Risk (04/30/2024)   Overall Financial Resource Strain (CARDIA)    Difficulty of Paying Living Expenses: Somewhat hard  Food Insecurity: No Food Insecurity (04/30/2024)   Hunger Vital Sign    Worried About Running Out of Food in the Last Year: Never true    Ran Out of Food in the Last Year: Never true  Transportation Needs: No Transportation Needs (04/30/2024)   PRAPARE - Administrator, Civil Service (Medical): No    Lack of Transportation (Non-Medical): No  Physical Activity: Sufficiently Active (04/30/2024)   Exercise Vital Sign  Days of Exercise per Week: 5 days    Minutes of Exercise per Session: 40 min  Stress: No Stress Concern Present (04/30/2024)   [employer] of Occupational Health - Occupational Stress Questionnaire    Feeling of Stress: Only a little  Social Connections: Unknown (04/30/2024)   Social Connection and Isolation Panel    Frequency of Communication with Friends and Family: More than three times a week    Frequency of Social Gatherings with Friends and Family: More than three times a week    Attends Religious Services: 1 to 4 times per year    Active Member of [employer] or Organizations: No    Attends Engineer, structural: Not on file    Marital Status: Patient declined  Intimate Partner Violence: Not on file    ROS:  Constitutional: Patient reports difficulty losing weight.  Denies fever, malaise, fatigue, headache or abrupt weight  changes.  Respiratory: Denies difficulty breathing, shortness of breath, cough or sputum production.   Cardiovascular: Denies chest pain, chest tightness, palpitations or swelling in the hands or feet.  Gastrointestinal: Pt reports intermittent constipation. Denies abdominal pain, bloating, diarrhea or blood in the stool.  GU: Pt reports urinary frequency. Denies urgency, pain with urination, blood in urine, odor or discharge. Musculoskeletal: Denies decrease in range of motion, difficulty with gait, muscle pain or joint pain and swelling.  Skin: Pt reports anal lesion. Denies redness, rashes, or ulcercations.  Neurological: Denies dizziness, difficulty with memory, difficulty with speech or problems with balance and coordination.  No other specific complaints in a complete review of systems (except as listed in HPI above).  PE: BP 118/68 (BP Location: Left Arm, Patient Position: Sitting, Cuff Size: Normal)   Ht 5' 7 (1.702 m)   Wt 199 lb 6.4 oz (90.4 kg)   LMP 04/29/2024 (Exact Date)   BMI 31.23 kg/m    Wt Readings from Last 3 Encounters:  03/28/24 207 lb (93.9 kg) (98%, Z= 2.04)*  04/11/23 226 lb (102.5 kg) (99%, Z= 2.26)*  03/24/23 227 lb (103 kg) (99%, Z= 2.27)*   * Growth percentiles are based on CDC (Girls, 2-20 Years) data.    General: Appears her stated age, obese, in NAD. Cardiovascular: Normal rate and rhythm. S1,S2 noted.  No murmur, rubs or gallops noted.  Pulmonary/Chest: Normal effort and positive vesicular breath sounds. No respiratory distress. No wheezes, rales or ronchi noted.  Rectal: 2 mm ulceration noted at 7:00 around the anus. Musculoskeletal:  No difficulty with gait.  Neurological: Alert and oriented.    BMET    Component Value Date/Time   NA 138 06/16/2022 1943   K 3.7 06/16/2022 1943   CL 106 06/16/2022 1943   CO2 26 06/16/2022 1943   GLUCOSE 94 06/16/2022 1943   BUN 10 06/16/2022 1943   CREATININE 0.74 06/16/2022 1943   CALCIUM 9.3 06/16/2022  1943   GFRNONAA >60 06/16/2022 1943   GFRAA 0 (L) 09/27/2018 2240    Lipid Panel  No results found for: CHOL, TRIG, HDL, CHOLHDL, VLDL, LDLCALC  CBC    Component Value Date/Time   WBC 9.0 06/16/2022 1943   RBC 4.98 06/16/2022 1943   HGB 14.0 06/16/2022 1943   HCT 42.4 06/16/2022 1943   PLT 237 06/16/2022 1943   MCV 85.1 06/16/2022 1943   MCH 28.1 06/16/2022 1943   MCHC 33.0 06/16/2022 1943   RDW 12.7 06/16/2022 1943   LYMPHSABS 2.5 06/16/2022 1943   MONOABS 0.6 06/16/2022 1943   EOSABS  0.1 06/16/2022 1943   BASOSABS 0.0 06/16/2022 1943    Hgb A1C No results found for: HGBA1C   Assessment and Plan:  Anal lesion:  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

## 2024-05-01 NOTE — Patient Instructions (Signed)
 Genital Herpes Genital herpes is a common sexually transmitted infection (STI) that is caused by a virus. The virus spreads from person to person through contact with a sore, infected saliva, or infected skin. The virus can cause itching, blisters, and sores around the genitals or rectum. During an outbreak of infection, symptoms may last for several days and then go away. However, the virus remains in the body, so more outbreaks may happen in the future. The time between outbreaks varies and can be from months to years. Genital herpes can affect anyone. It is particularly concerning for pregnant women because the virus can be passed to the baby during delivery. Genital herpes is also a concern for people who have a weak disease-fighting system (immune system). What are the causes? This condition is caused by the herpes simplex virus, type 1 or type 2 (HSV-1 or HSV-2). The virus may spread through: Sexual contact with an infected person, including vaginal, anal, and oral sex. Contact with a herpes sore. The skin. This means that you can get herpes from an infected partner even if there are no blisters or sores present. Your partner may not know that he or she is infected. What increases the risk? You are more likely to develop this condition if: You have sex with many partners. You do not use latex or polyurethane condoms during sex. What are the signs or symptoms? Most people do not have symptoms or they have mild symptoms that may be mistaken for other skin problems. Symptoms may include: Small, red bumps near the genitals, rectum, or mouth. These bumps turn into blisters and then sores. Flu-like (influenza-like) symptoms, including: Fever. Body aches. Swollen lymph nodes. Headache. Painful urination. Pain and itching in the genital area or rectal area. Vaginal discharge. Tingling or shooting pain in the legs and buttocks. Generally, symptoms are more severe and last longer during the first  (primary) outbreak. Influenza-like symptoms are also more common during the primary outbreak. How is this diagnosed? This condition may be diagnosed based on: A physical exam. Your medical history. Blood tests. A test of a fluid sample (culture) from an open sore. How is this treated? There is no cure for this condition, but treatment with antiviral medicines can do the following: Speed up healing and relieve symptoms. Help to reduce the spread of the virus to sexual partners. Limit the chance of future outbreaks, or make future outbreaks shorter. Lessen symptoms of future outbreaks. Your health care provider may also recommend over-the-counter medicines to help with pain and itching. Follow these instructions at home: If you have an outbreak:  Keep the affected areas dry and clean. Avoid rubbing or touching blisters and sores. If you do touch blisters or sores: Wash your hands thoroughly with soap and water for at least 20 seconds. If soap and water are not available, use an alcohol-based hand sanitizer. Do not touch your eyes afterward. Sexual activity Do not have sexual contact during active outbreaks. Practice safe sex. Herpes can spread even if your partner does not have blisters or sores. Latex or polyurethane condoms and female condoms may help prevent the spread of the herpes virus. Managing pain and discomfort If directed, put ice on the painful area. To do this: Put ice in a plastic bag. Place a towel between your skin and the bag. Leave the ice on for 20 minutes, 2-3 times a day. Remove the ice if your skin turns bright red. This is very important. If you cannot feel pain, heat, or  cold, you have a greater risk of damage to the area. If told, take a cool sitz bath to help relieve pain or itching. A sitz bath is a water bath that you take while sitting down in water that is deep enough to cover your hips and buttocks. General instructions Take over-the-counter and  prescription medicines only as told by your health care provider. If you were prescribed an antiviral medicine, use it as told by your health care provider. Do not stop using the antiviral even if you start to feel better. Keep all follow-up visits. This is important. How is this prevented? Use condoms. Although you can get genital herpes during sexual contact even with the use of a condom, a condom can provide some protection. Avoid having multiple sexual partners. Talk with your sexual partner about any symptoms either of you may have. Also, talk with your partner about any history of STIs. Do not have sexual contact if you have active symptoms of genital herpes. Contact a health care provider if: Your symptoms are not improving with medicine. Your symptoms return, or you have new symptoms. You have a fever. You have abdominal pain. You have redness, swelling, or pain in your eye. You notice new sores on other parts of your body. You have had herpes and you become pregnant or plan to become pregnant. Get help right away if: You have symptoms of viral meningitis. This is rare but may happen if the virus spreads to the brain. Symptoms may include: Severe headache or stiff neck. Muscle aches. Nausea and vomiting. Sensitivity to light. Summary Genital herpes is a common sexually transmitted infection (STI) that is caused by the herpes simplex virus, type 1 or type 2 (HSV-1 or HSV-2). These viruses are most often spread through sexual contact with an infected person. You are more likely to develop this condition if you have sex with many partners or you do not use condoms during sex. Most people do not have symptoms or have mild symptoms that may be mistaken for other skin problems. Symptoms occur as outbreaks that may happen months or years apart. There is no cure for this condition, but treatment with oral antiviral medicines can reduce symptoms, reduce the chance of spreading the virus to  a partner, prevent future outbreaks, or shorten future outbreaks. This information is not intended to replace advice given to you by your health care provider. Make sure you discuss any questions you have with your health care provider. Document Revised: 07/23/2021 Document Reviewed: 07/23/2021 Elsevier Patient Education  2024 [employer].

## 2024-05-01 NOTE — Assessment & Plan Note (Signed)
 Encourage diet and exercise for weight loss Will increase phentermine  to 30 mg daily

## 2024-05-02 ENCOUNTER — Encounter: Payer: Self-pay | Admitting: Internal Medicine

## 2024-05-03 LAB — HSV(HERPES SIMPLEX VRS) I + II AB-IGG
HSV 1 IGG,TYPE SPECIFIC AB: <0.9 {index}
HSV 2 IGG,TYPE SPECIFIC AB: 9.6 {index} — ABNORMAL HIGH

## 2024-05-05 ENCOUNTER — Ambulatory Visit: Payer: Self-pay | Admitting: Internal Medicine

## 2024-05-08 MED ORDER — VALACYCLOVIR HCL 1 G PO TABS: 1000.0000 mg | ORAL_TABLET | Freq: Two times a day (BID) | ORAL | 0 refills | Status: AC

## 2024-05-17 ENCOUNTER — Encounter: Payer: Self-pay | Admitting: Internal Medicine

## 2024-05-17 DIAGNOSIS — R3911 Hesitancy of micturition: Secondary | ICD-10-CM

## 2024-05-24 ENCOUNTER — Encounter: Payer: Self-pay | Admitting: Internal Medicine

## 2024-05-25 MED ORDER — PHENTERMINE HCL 37.5 MG PO CAPS: 37.5000 mg | ORAL_CAPSULE | ORAL | 0 refills | Status: DC

## 2024-05-25 NOTE — Addendum Note (Signed)
 Addended by: ANTONETTE ANGELINE ORN on: 05/25/2024 02:40 PM   Modules accepted: Orders

## 2024-05-31 ENCOUNTER — Encounter: Payer: Self-pay | Admitting: Internal Medicine

## 2024-06-22 ENCOUNTER — Ambulatory Visit: Admitting: Urology

## 2024-06-22 ENCOUNTER — Encounter: Payer: Self-pay | Admitting: Internal Medicine

## 2024-06-22 VITALS — BP 105/72 | HR 86 | Ht 67.0 in | Wt 187.7 lb

## 2024-06-22 DIAGNOSIS — R3911 Hesitancy of micturition: Secondary | ICD-10-CM | POA: Diagnosis not present

## 2024-06-22 DIAGNOSIS — R102 Pelvic and perineal pain: Secondary | ICD-10-CM

## 2024-06-22 DIAGNOSIS — R399 Unspecified symptoms and signs involving the genitourinary system: Secondary | ICD-10-CM

## 2024-06-22 LAB — URINALYSIS, COMPLETE
Bilirubin, UA: NEGATIVE
Glucose, UA: NEGATIVE
Ketones, UA: NEGATIVE
Leukocytes,UA: NEGATIVE
Nitrite, UA: NEGATIVE
Protein,UA: NEGATIVE
RBC, UA: NEGATIVE
Specific Gravity, UA: 1.01 (ref 1.005–1.030)
Urobilinogen, Ur: 0.2 mg/dL (ref 0.2–1.0)
pH, UA: 6.5 (ref 5.0–7.5)

## 2024-06-22 LAB — MICROSCOPIC EXAMINATION
Mucus, UA: NONE SEEN
RBC, Urine: NONE SEEN /HPF (ref 0–2)

## 2024-06-22 LAB — BLADDER SCAN AMB NON-IMAGING

## 2024-06-22 MED ORDER — GEMTESA 75 MG PO TABS: 75.0000 mg | ORAL_TABLET | Freq: Every day | ORAL | 0 refills | Status: DC

## 2024-06-22 NOTE — Progress Notes (Unsigned)
 06/22/2024 9:46 AM   Holly Everett Jan 06, 2006 [id number]  Referring provider: Antonette Angeline ORN, NP [address] [place],  [place] [address]  Chief Complaint  Patient presents with   Urinary Urgency    HPI: Holly Everett is a 18 y.o. female referred for evaluation of lower urinary tract symptoms.  3-year history of bothersome lower urinary tract symptoms including urinary frequency, urgency, straining to void and sensation of incomplete emptying.  She estimates her frequency occurs ~every 20 minutes.  For the last 9 months she has noted straining to void.  Intermittent bladder pressure/pain. Denies dysuria, gross hematuria or recurrent UTI No prior history of neurologic problems Was started on oxybutynin  May 2025 which she took for ~2 months.  She thought her symptoms improved initially then returned and she stopped the medication   PMH: No past medical history on file.  Surgical History: Past Surgical History:  Procedure Laterality Date   TONSILLECTOMY      Home Medications:  Allergies as of 06/22/2024   No Known Allergies      Medication List        Accurate as of June 22, 2024  9:46 AM. If you have any questions, ask your nurse or doctor.          STOP taking these medications    oxybutynin  5 MG 24 hr tablet Commonly known as: DITROPAN -XL       TAKE these medications    albuterol  108 (90 Base) MCG/ACT inhaler Commonly known as: VENTOLIN  HFA TAKE 2 PUFFS BY MOUTH EVERY 6 HOURS AS NEEDED FOR WHEEZE OR SHORTNESS OF BREATH   Gemtesa  75 MG Tabs Generic drug: Vibegron  Take 1 tablet (75 mg total) by mouth daily.   glycopyrrolate  1 MG tablet Commonly known as: ROBINUL  Take 1 tablet (1 mg total) by mouth daily.   ibuprofen  800 MG tablet Commonly known as: ADVIL  Take 800 mg by mouth.   nicotine  14 mg/24hr patch Commonly known as: NICODERM CQ  - dosed in mg/24 hours PLACE 1 PATCH ONTO THE SKIN DAILY.   phentermine  37.5 MG capsule Take 1 capsule (37.5 mg  total) by mouth every morning.   tranexamic acid 650 MG Tabs tablet Commonly known as: LYSTEDA Take 1,300 mg by mouth.        Allergies: No Known Allergies  Family History: Family History  Problem Relation Age of Onset   Hypertension Mother    Hypertension Father    Healthy Sister    Healthy Brother    Diabetes Maternal Grandmother    Hypertension Maternal Grandmother    Cancer Maternal Grandfather        ??    Social History:  reports that she has never smoked. She has never used smokeless tobacco. No history on file for alcohol use and drug use.   Physical Exam: BP 105/72 (BP Location: Left Arm, Patient Position: Sitting, Cuff Size: Normal)   Pulse 86   Ht 5' 7 (1.702 m)   Wt 187 lb 11.2 oz (85.1 kg)   LMP 06/22/2024 (Exact Date)   SpO2 99%   BMI 29.40 kg/m   Constitutional:  Alert, No acute distress. HEENT: [place] AT Respiratory: Normal respiratory effort, no increased work of breathing. Psychiatric: Normal mood and affect.  Laboratory:  Urinalysis Dipstick/microscopy negative  Assessment & Plan:    1.  Lower urinary tract symptoms She was initially unable to provide a urine specimen and estimated bladder volume by bladder scan was 15 mL We discussed some of her  urinary hesitancy and straining may be related to bladder overactivity causing the sensation of a full bladder and she is actually empty Trial Gemtesa  75 mg daily PA follow-up 1 month for symptom reassessment  2.  Pelvic pain Intermittent pelvic pain/pressure Reassess 1 month after Gemtesa  trial    Return in about 1 month (around 07/23/2024).   Glendia JAYSON Barba, MD  [facility] [address] [place], [place] [address] [phone]

## 2024-06-25 MED ORDER — PHENTERMINE HCL 37.5 MG PO CAPS: 37.5000 mg | ORAL_CAPSULE | ORAL | 0 refills | Status: DC

## 2024-06-26 ENCOUNTER — Encounter: Payer: Self-pay | Admitting: Urology

## 2024-07-25 ENCOUNTER — Encounter: Payer: Self-pay | Admitting: Internal Medicine

## 2024-07-25 MED ORDER — PHENTERMINE HCL 37.5 MG PO CAPS: 37.5000 mg | ORAL_CAPSULE | ORAL | 0 refills | Status: AC

## 2024-07-30 ENCOUNTER — Ambulatory Visit: Admitting: Physician Assistant

## 2024-07-30 ENCOUNTER — Ambulatory Visit
Admission: RE | Admit: 2024-07-30 | Discharge: 2024-07-30 | Disposition: A | Discharge status: Home / Self Care | Source: Ambulatory Visit | Attending: Family Medicine | Admitting: Family Medicine

## 2024-07-30 VITALS — BP 102/73 | HR 116 | Temp 99.2°F | Resp 16 | Wt 181.0 lb

## 2024-07-30 DIAGNOSIS — J069 Acute upper respiratory infection, unspecified: Secondary | ICD-10-CM | POA: Diagnosis not present

## 2024-07-30 LAB — GROUP A STREP BY PCR: Group A Strep by PCR: NOT DETECTED

## 2024-07-30 LAB — SARS CORONAVIRUS 2 BY RT PCR: SARS Coronavirus 2 by RT PCR: NEGATIVE

## 2024-07-30 MED ORDER — BENZONATATE 100 MG PO CAPS: 100.0000 mg | ORAL_CAPSULE | Freq: Three times a day (TID) | ORAL | 0 refills | Status: AC

## 2024-07-30 MED ORDER — PROMETHAZINE-DM 6.25-15 MG/5ML PO SYRP: 2.5000 mL | ORAL_SOLUTION | Freq: Four times a day (QID) | ORAL | 0 refills | Status: AC | PRN

## 2024-07-30 MED ORDER — IPRATROPIUM BROMIDE 0.06 % NA SOLN: 2.0000 | Freq: Four times a day (QID) | NASAL | 0 refills | Status: AC

## 2024-07-30 NOTE — ED Triage Notes (Signed)
 Pt states extremely congested, cough sounds very dry x4 days. - Entered by patient. Has tried OTC meds w/o relief.

## 2024-07-30 NOTE — Discharge Instructions (Signed)
 We will contact you if your COVID or strep test is positive.  If negative, you have a viral respiratory infection that will gradually improve over the next 7-10 days. Cough may last up to 3 weeks.  If your were prescribed medication, stop by the pharmacy to pick them up.   You can take Tylenol and/or Ibuprofen  as needed for fever reduction and pain relief.    For cough: honey 1/2 to 1 teaspoon (you can dilute the honey in water or another fluid). Stop at the pharmacy to pick up your prescription cough medication.   You can use a humidifier for chest congestion and cough.  If you don't have a humidifier, you can sit in the bathroom with the hot shower running.      For sore throat: try warm salt water gargles, Mucinex sore throat cough drops or cepacol lozenges, throat spray, warm tea or water with lemon/honey, popsicles or ice, or OTC cold relief medicine for throat discomfort. You can also purchase chloraseptic spray at the pharmacy or dollar store.    For congestion: take a daily anti-histamine like Zyrtec, Claritin, and a oral decongestant, such as pseudoephedrine.  You can also use Flonase  1-2 sprays in each nostril daily. Afrin is also a good option, if you do not have high blood pressure.  Pick up the prescription nasal spray from the pharmacy.    It is important to stay hydrated: drink plenty of fluids (water, gatorade/powerade/pedialyte, juices, or teas) to keep your throat moisturized and help further relieve irritation/discomfort.    Return or go to the Emergency Department if symptoms worsen or do not improve in the next few days

## 2024-07-30 NOTE — ED Provider Notes (Signed)
 [facility]    CSN: [id number] Arrival date & time: 07/30/24  1141      History   Chief Complaint Chief Complaint  Patient presents with   Nasal Congestion   Cough    HPI Holly Everett is a 18 y.o. female.   HPI  History obtained from the patient. Holly Everett presents for 2 days of sore throat, dry cough, nasal congestion, generalized weakness, headache, ear pain.  Took some sinus medication, NyQuil,  vitamins and zinc with some improvement.  Denies fever, vomiting or diarrhea. Endorses nausea.,    She returned from [place]  [place] visiting family but had to go through an airport.   She vapes.      History reviewed. No pertinent past medical history.  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      Home Medications    Prior to Admission medications   Medication Sig Start Date End Date Taking? Authorizing Provider  benzonatate  (TESSALON ) 100 MG capsule Take 1 capsule (100 mg total) by mouth every 8 (eight) hours. 07/30/24  Yes Kyrstan Gotwalt, DO  ipratropium (ATROVENT) 0.06 % nasal spray Place 2 sprays into both nostrils 4 (four) times daily. 07/30/24  Yes Lorette Peterkin, DO  promethazine-dextromethorphan (PROMETHAZINE-DM) 6.25-15 MG/5ML syrup Take 2.5 mLs by mouth 4 (four) times daily as needed. 07/30/24  Yes Leno Mathes, DO  albuterol  (VENTOLIN  HFA) 108 (90 Base) MCG/ACT inhaler TAKE 2 PUFFS BY MOUTH EVERY 6 HOURS AS NEEDED FOR WHEEZE OR SHORTNESS OF BREATH 04/25/24   Antonette Angeline ORN, NP  glycopyrrolate  (ROBINUL ) 1 MG tablet Take 1 tablet (1 mg total) by mouth daily. 03/24/23   Antonette Angeline ORN, NP  ibuprofen  (ADVIL ) 800 MG  tablet Take 800 mg by mouth. 01/10/24   [provider]  nicotine  (NICODERM CQ  - DOSED IN MG/24 HOURS) 14 mg/24hr patch PLACE 1 PATCH ONTO THE SKIN DAILY. 04/24/24   Antonette Angeline ORN, NP  phentermine  37.5 MG capsule Take 1 capsule (37.5 mg total) by mouth every morning. 07/25/24   Antonette Angeline ORN, NP  tranexamic acid (LYSTEDA) 650 MG TABS tablet Take 1,300 mg by mouth. 01/10/24   [provider]  Vibegron  (GEMTESA ) 75 MG TABS Take 1 tablet (75 mg total) by mouth daily. 06/22/24   Twylla Glendia BROCKS, MD    Family History Family History  Problem Relation Age of Onset   Hypertension Mother    Hypertension Father    Healthy Sister    Healthy Brother    Diabetes Maternal Grandmother    Hypertension Maternal Grandmother    Cancer Maternal Grandfather        ??    Social History Social History   Tobacco Use   Smoking status: Never   Smokeless tobacco: Never  Vaping Use   Vaping status: Every Day     Allergies   Patient has no known allergies.   Review of Systems Review of Systems: negative unless otherwise stated in HPI.      Physical Exam Triage Vital Signs ED Triage Vitals  Encounter Vitals Group     BP 07/30/24 1203 102/73  Girls Systolic BP Percentile --      Girls Diastolic BP Percentile --      Boys Systolic BP Percentile --      Boys Diastolic BP Percentile --      Pulse Rate 07/30/24 1203 (!) 116     Resp 07/30/24 1203 16     Temp 07/30/24 1203 99.2 F (37.3 C)     Temp Source 07/30/24 1203 Oral     SpO2 07/30/24 1203 98 %     Weight 07/30/24 1203 181 lb (82.1 kg)     Height --      Head Circumference --      Peak Flow --      Pain Score 07/30/24 1208 8     Pain Loc --      Pain Education --      Exclude from Growth Chart --    No data found.  Updated Vital Signs BP 102/73 (BP Location: Right Arm)   Pulse (!) 116   Temp 99.2 F (37.3 C) (Oral)   Resp 16   Wt 82.1 kg   LMP 07/18/2024 (Exact Date)   SpO2 98%   BMI 28.35 kg/m    Visual Acuity Right Eye Distance:   Left Eye Distance:   Bilateral Distance:    Right Eye Near:   Left Eye Near:    Bilateral Near:     Physical Exam GEN:     alert, ill but non-toxic appearing female in no distress    HENT:  mucus membranes moist, oropharyngeal without lesions, mild erythema, no tonsillar hypertrophy or exudates,  moderate erythematous edematous turbinates, clear nasal discharge, bilateral TM normal EYES:   pupils equal and reactive, no scleral injection or discharge NECK:  normal ROM, +lymphadenopathy, no meningismus   RESP:  no increased work of breathing, clear to auscultation bilaterally CVS:   regular  rhythm, tachycardia  Skin:   warm and dry, no rash on visible skin    UC Treatments / Results  Labs (all labs ordered are listed, but only abnormal results are displayed) Labs Reviewed  GROUP A STREP BY PCR  SARS CORONAVIRUS 2 BY RT PCR    EKG   Radiology No results found.   Procedures Procedures (including critical care time)  Medications Ordered in UC Medications - No data to display  Initial Impression / Assessment and Plan / UC Course  I have reviewed the triage vital signs and the nursing notes.  Pertinent labs & imaging results that were available during my care of the patient were reviewed by me and considered in my medical decision making (see chart for details).       Pt is a 18 y.o. female who presents for 2 days of respiratory symptoms. [name] is afebrile here without recent antipyretics. She is tachycardic.  Satting well on room air. Overall pt is ill but non-toxic appearing, well hydrated, without respiratory distress. Pulmonary exam is unremarkable.  COVID and strep  obtained and was negative. I will call patient with test results, if positive. History most  consistent with viral respiratory illness. Discussed symptomatic treatment.  Promethazine DM and Tessalon  Perles for cough.  Atrovent nasal spray for nasal congestion.  She  has an albuterol  inhaler at home to use.  Explained lack of efficacy of antibiotics in viral disease.  Typical duration of symptoms discussed.  Work note provided.  Return and ED precautions given and voiced understanding. Discussed MDM, treatment plan and plan for follow-up with patient  who agrees with plan.   COVID is negative.  Strep is negative.  Final Clinical Impressions(s) / UC Diagnoses   Final diagnoses:  URI with cough and congestion     Discharge Instructions      We will contact you if your COVID or strep test is positive.  If negative, you have a viral respiratory infection that will gradually improve over the next 7-10 days. Cough may last up to 3 weeks.  If your were prescribed medication, stop by the pharmacy to pick them up.   You can take Tylenol and/or Ibuprofen  as needed for fever reduction and pain relief.    For cough: honey 1/2 to 1 teaspoon (you can dilute the honey in water or another fluid). Stop at the pharmacy to pick up your prescription cough medication.   You can use a humidifier for chest congestion and cough.  If you don't have a humidifier, you can sit in the bathroom with the hot shower running.      For sore throat: try warm salt water gargles, Mucinex sore throat cough drops or cepacol lozenges, throat spray, warm tea or water with lemon/honey, popsicles or ice, or OTC cold relief medicine for throat discomfort. You can also purchase chloraseptic spray at the pharmacy or dollar store.    For congestion: take a daily anti-histamine like Zyrtec, Claritin, and a oral decongestant, such as pseudoephedrine.  You can also use Flonase  1-2 sprays in each nostril daily. Afrin is also a good option, if you do not have high blood pressure.  Pick up the prescription nasal spray from the pharmacy.    It is important to stay hydrated: drink plenty of fluids (water, gatorade/powerade/pedialyte, juices, or teas) to keep your throat moisturized and help further relieve  irritation/discomfort.    Return or go to the Emergency Department if symptoms worsen or do not improve in the next few days      ED Prescriptions     Medication Sig Dispense Auth. Provider   promethazine-dextromethorphan (PROMETHAZINE-DM) 6.25-15 MG/5ML syrup Take 2.5 mLs by mouth 4 (four) times daily as needed. 118 mL Takiesha Mcdevitt, DO   benzonatate  (TESSALON ) 100 MG capsule Take 1 capsule (100 mg total) by mouth every 8 (eight) hours. 21 capsule Rockwell Zentz, DO   ipratropium (ATROVENT) 0.06 % nasal spray Place 2 sprays into both nostrils 4 (four) times daily. 15 mL Annaleah Arata, DO      PDMP not reviewed this encounter.   Jordi Lacko, DO 07/30/24 1256

## 2024-07-31 ENCOUNTER — Ambulatory Visit: Admitting: Physician Assistant

## 2024-07-31 VITALS — BP 124/64 | HR 107 | Ht 67.0 in | Wt 178.0 lb

## 2024-07-31 DIAGNOSIS — R399 Unspecified symptoms and signs involving the genitourinary system: Secondary | ICD-10-CM | POA: Diagnosis not present

## 2024-07-31 LAB — BLADDER SCAN AMB NON-IMAGING

## 2024-07-31 NOTE — Progress Notes (Signed)
 07/31/2024 10:05 AM   Holly Everett July 03, 2006 [id number]  CC: Chief Complaint  Patient presents with   Follow-up   HPI: Holly Everett is a 18 y.o. female with PMH LUTS who previously lost efficacy on oxybutynin  who presents today for symptom recheck on Gemtesa .   Today she reports she developed straining with urination on Gemtesa  and stopped it.  She feels better when she is not taking it.  She took a recent long distance flight and did some bladder training exercises while on the plane, which helped considerably.  She has also been cutting out sodas with some improvement.  She still has some occasional straining, though this is less bothersome for her.  PVR 57mL.  PMH: No past medical history on file.  Surgical History: Past Surgical History:  Procedure Laterality Date   TONSILLECTOMY      Home Medications:  Allergies as of 07/31/2024   No Known Allergies      Medication List        Accurate as of July 31, 2024 10:05 AM. If you have any questions, ask your nurse or doctor.          albuterol  108 (90 Base) MCG/ACT inhaler Commonly known as: VENTOLIN  HFA TAKE 2 PUFFS BY MOUTH EVERY 6 HOURS AS NEEDED FOR WHEEZE OR SHORTNESS OF BREATH   benzonatate  100 MG capsule Commonly known as: TESSALON  Take 1 capsule (100 mg total) by mouth every 8 (eight) hours.   Gemtesa  75 MG Tabs Generic drug: Vibegron  Take 1 tablet (75 mg total) by mouth daily.   glycopyrrolate  1 MG tablet Commonly known as: ROBINUL  Take 1 tablet (1 mg total) by mouth daily.   ibuprofen  800 MG tablet Commonly known as: ADVIL  Take 800 mg by mouth.   ipratropium 0.06 % nasal spray Commonly known as: ATROVENT Place 2 sprays into both nostrils 4 (four) times daily.   nicotine  14 mg/24hr patch Commonly known as: NICODERM CQ  - dosed in mg/24 hours PLACE 1 PATCH ONTO THE SKIN DAILY.   phentermine  37.5 MG capsule Take 1 capsule (37.5 mg total) by mouth every morning.    promethazine-dextromethorphan 6.25-15 MG/5ML syrup Commonly known as: PROMETHAZINE-DM Take 2.5 mLs by mouth 4 (four) times daily as needed.   tranexamic acid 650 MG Tabs tablet Commonly known as: LYSTEDA Take 1,300 mg by mouth.        Allergies:  No Known Allergies  Family History: Family History  Problem Relation Age of Onset   Hypertension Mother    Hypertension Father    Healthy Sister    Healthy Brother    Diabetes Maternal Grandmother    Hypertension Maternal Grandmother    Cancer Maternal Grandfather        ??    Social History:   reports that she has never smoked. She has never used smokeless tobacco. No history on file for alcohol use and drug use.  Physical Exam: BP 124/64 (BP Location: Left Arm, Patient Position: Sitting, Cuff Size: Normal)   Pulse (!) 107   Ht 5' 7 (1.702 m)   Wt 178 lb (80.7 kg)   LMP 07/18/2024 (Exact Date)   SpO2 99%   BMI 27.88 kg/m   Constitutional:  Alert and oriented, no acute distress, nontoxic appearing HEENT: [place], AT Cardiovascular: No clubbing, cyanosis, or edema Respiratory: Normal respiratory effort, no increased work of breathing Skin: No rashes, bruises or suspicious lesions Neurologic: Grossly intact, no focal deficits, moving all 4 extremities Psychiatric: Normal mood and affect  Laboratory Data: Results for orders placed or performed in visit on 07/31/24  Bladder Scan (Post Void Residual) in office   Collection Time: 07/31/24 10:04 AM  Result Value Ref Range   Scan Result 57ml    Assessment & Plan:   1. Lower urinary tract symptoms (Primary) Straining on Gemtesa , agree with stopping this.  With recent symptomatic improvement with bladder training exercises and mixed storage and obstructive symptoms, I do question an element of pelvic floor dysfunction.  I recommended pelvic floor physical therapy referral and she agreed.  We discussed that they are backed up a bit and so there will be a delay before she is called  about this.  In the meantime, I am giving her pelvic stretches in her AVS today.  She is in agreement with this plan. - Bladder Scan (Post Void Residual) in office - Ambulatory referral to Physical Therapy   Return if symptoms worsen or fail to improve.  Lucie Hones, PA-C  [facility] [place] [address] [place], [place] [address] [phone]

## 2024-08-25 ENCOUNTER — Encounter: Payer: Self-pay | Admitting: Internal Medicine

## 2024-10-04 ENCOUNTER — Ambulatory Visit: Admitting: Internal Medicine

## 2024-10-17 DIAGNOSIS — L700 Acne vulgaris: Secondary | ICD-10-CM | POA: Diagnosis not present

## 2024-10-17 DIAGNOSIS — L732 Hidradenitis suppurativa: Secondary | ICD-10-CM | POA: Diagnosis not present

## 2024-11-12 ENCOUNTER — Encounter: Payer: Self-pay | Admitting: Internal Medicine
